# Patient Record
Sex: Male | Born: 1945 | Race: White | Hispanic: No | Smoking: Never smoker
Health system: Southern US, Community
[De-identification: ages and names within clinical notes are randomized; demographics above are authoritative.]

## PROBLEM LIST (undated history)

## (undated) DIAGNOSIS — K2100 Gastro-esophageal reflux disease with esophagitis, without bleeding: Secondary | ICD-10-CM

## (undated) HISTORY — PX: OTHER SURGICAL HISTORY: SHX169

---

## 2020-07-06 ENCOUNTER — Encounter (HOSPITAL_COMMUNITY): Payer: Self-pay

## 2020-07-06 ENCOUNTER — Other Ambulatory Visit: Payer: Self-pay

## 2020-07-06 ENCOUNTER — Emergency Department (HOSPITAL_COMMUNITY): Payer: Medicare HMO

## 2020-07-06 ENCOUNTER — Emergency Department (HOSPITAL_COMMUNITY)
Admission: EM | Admit: 2020-07-06 | Discharge: 2020-07-06 | Disposition: A | Discharge status: Home / Self Care | Payer: Medicare HMO | Attending: Emergency Medicine | Admitting: Emergency Medicine

## 2020-07-06 DIAGNOSIS — Y900 Blood alcohol level of less than 20 mg/100 ml: Secondary | ICD-10-CM | POA: Insufficient documentation

## 2020-07-06 DIAGNOSIS — W108XXA Fall (on) (from) other stairs and steps, initial encounter: Secondary | ICD-10-CM | POA: Insufficient documentation

## 2020-07-06 DIAGNOSIS — S0081XA Abrasion of other part of head, initial encounter: Secondary | ICD-10-CM | POA: Diagnosis not present

## 2020-07-06 DIAGNOSIS — W19XXXA Unspecified fall, initial encounter: Secondary | ICD-10-CM

## 2020-07-06 DIAGNOSIS — Y9301 Activity, walking, marching and hiking: Secondary | ICD-10-CM | POA: Diagnosis not present

## 2020-07-06 DIAGNOSIS — Z23 Encounter for immunization: Secondary | ICD-10-CM | POA: Insufficient documentation

## 2020-07-06 DIAGNOSIS — S0990XA Unspecified injury of head, initial encounter: Secondary | ICD-10-CM | POA: Diagnosis present

## 2020-07-06 HISTORY — DX: Gastro-esophageal reflux disease with esophagitis, without bleeding: K21.00

## 2020-07-06 LAB — CBC WITH DIFFERENTIAL/PLATELET
Abs Immature Granulocytes: 0.04 10*3/uL (ref 0.00–0.07)
Basophils Absolute: 0.1 10*3/uL (ref 0.0–0.1)
Basophils Relative: 0 %
Eosinophils Absolute: 0.8 10*3/uL — ABNORMAL HIGH (ref 0.0–0.5)
Eosinophils Relative: 7 %
HCT: 35.5 % — ABNORMAL LOW (ref 39.0–52.0)
Hemoglobin: 11.3 g/dL — ABNORMAL LOW (ref 13.0–17.0)
Immature Granulocytes: 0 %
Lymphocytes Relative: 14 %
Lymphs Abs: 1.6 10*3/uL (ref 0.7–4.0)
MCH: 29.7 pg (ref 26.0–34.0)
MCHC: 31.8 g/dL (ref 30.0–36.0)
MCV: 93.2 fL (ref 80.0–100.0)
Monocytes Absolute: 0.7 10*3/uL (ref 0.1–1.0)
Monocytes Relative: 6 %
Neutro Abs: 8.2 10*3/uL — ABNORMAL HIGH (ref 1.7–7.7)
Neutrophils Relative %: 73 %
Platelets: 227 10*3/uL (ref 150–400)
RBC: 3.81 MIL/uL — ABNORMAL LOW (ref 4.22–5.81)
RDW: 13.8 % (ref 11.5–15.5)
WBC: 11.4 10*3/uL — ABNORMAL HIGH (ref 4.0–10.5)
nRBC: 0 % (ref 0.0–0.2)

## 2020-07-06 LAB — COMPREHENSIVE METABOLIC PANEL
ALT: 18 U/L (ref 0–44)
AST: 17 U/L (ref 15–41)
Albumin: 2.9 g/dL — ABNORMAL LOW (ref 3.5–5.0)
Alkaline Phosphatase: 88 U/L (ref 38–126)
Anion gap: 9 (ref 5–15)
BUN: 21 mg/dL (ref 8–23)
CO2: 24 mmol/L (ref 22–32)
Calcium: 8.7 mg/dL — ABNORMAL LOW (ref 8.9–10.3)
Chloride: 105 mmol/L (ref 98–111)
Creatinine, Ser: 1 mg/dL (ref 0.61–1.24)
GFR, Estimated: >60 mL/min (ref >60)
Glucose, Bld: 84 mg/dL (ref 70–99)
Potassium: 4.2 mmol/L (ref 3.5–5.1)
Sodium: 138 mmol/L (ref 135–145)
Total Bilirubin: 0.7 mg/dL (ref 0.3–1.2)
Total Protein: 6.2 g/dL — ABNORMAL LOW (ref 6.5–8.1)

## 2020-07-06 LAB — ETHANOL: Alcohol, Ethyl (B): <10 mg/dL (ref <10)

## 2020-07-06 MED ORDER — TETANUS-DIPHTH-ACELL PERTUSSIS 5-2.5-18.5 LF-MCG/0.5 IM SUSY
0.5000 mL | PREFILLED_SYRINGE | Freq: Once | INTRAMUSCULAR | Status: AC
  Administered 2020-07-06: 0.5 mL via INTRAMUSCULAR
  Filled 2020-07-06: qty 0.5

## 2020-07-06 NOTE — ED Notes (Signed)
Pt ambulated to satisfaction.

## 2020-07-06 NOTE — ED Provider Notes (Signed)
[facility] EMERGENCY DEPARTMENT Provider Note   CSN: [id number] Arrival date & time: 07/06/20  1934     History Chief Complaint  Patient presents with  . Fall    Pt fell walking up stairs. Pt hit head on concrete. Pt denies any LOC. Pt denies any blood thinners. Pt has hematoma/abrasion to R side of forehead    Sanad Fearnow is a 75 y.o. male.  The history is provided by the patient and the EMS personnel.  Fall   Emil Weigold is a 75 y.o. male who presents to the Emergency Department complaining of fall. He presents the emergency department by EMS for evaluation of injuries following a fall. He was walking up the stairs and tripped and struck his head on concrete. He is unsure what made him trip. He denies any loss of consciousness. He was able to get himself back up. He denies any recent illnesses or preceding symptoms. He has no significant medical problems and only takes acid reflux medications. He lives at home alone.     Past Medical History:  Diagnosis Date  . Reflux esophagitis     There are no problems to display for this patient.  no significant past medical history.       No family history on file.  Social History   Tobacco Use  . Smoking status: Never Smoker  . Smokeless tobacco: Never Used  Substance Use Topics  . Alcohol use: Yes  . Drug use: Not Currently    Home Medications Prior to Admission medications   Not on File    Allergies    Penicillins  Review of Systems   Review of Systems  All other systems reviewed and are negative.   Physical Exam Updated Vital Signs BP (!) 132/94   Pulse 72   Resp (!) 26   Ht 5\' 9"  (1.753 m)   Wt 90 kg   SpO2 99%   BMI 29.30 kg/m   Physical Exam Vitals and nursing note reviewed.  Constitutional:      Appearance: He is well-developed.  HENT:     Head: Normocephalic.     Comments: Abrasion with swelling to the right temple. Cardiovascular:     Rate and Rhythm: Normal  rate and regular rhythm.     Heart sounds: No murmur heard.   Pulmonary:     Effort: Pulmonary effort is normal. No respiratory distress.     Breath sounds: Normal breath sounds.  Abdominal:     Palpations: Abdomen is soft.     Tenderness: There is no abdominal tenderness. There is no guarding or rebound.  Musculoskeletal:        General: No tenderness.  Skin:    General: Skin is warm and dry.  Neurological:     Mental Status: He is alert and oriented to person, place, and time.     Comments: Five out of five strength in all four extremities. Mildly dysarthria speech.  Psychiatric:        Behavior: Behavior normal.     ED Results / Procedures / Treatments   Labs (all labs ordered are listed, but only abnormal results are displayed) Labs Reviewed  COMPREHENSIVE METABOLIC PANEL - Abnormal; Notable for the following components:      Result Value   Calcium 8.7 (*)    Total Protein 6.2 (*)    Albumin 2.9 (*)    All other components within normal limits  CBC WITH DIFFERENTIAL/PLATELET - Abnormal; Notable for the following  components:   WBC 11.4 (*)    RBC 3.81 (*)    Hemoglobin 11.3 (*)    HCT 35.5 (*)    Neutro Abs 8.2 (*)    Eosinophils Absolute 0.8 (*)    All other components within normal limits  ETHANOL  URINALYSIS, ROUTINE W REFLEX MICROSCOPIC    EKG EKG Interpretation  Date/Time:  Sunday Jul 06 2020 19:33:00 EDT Ventricular Rate:  74 PR Interval:  169 QRS Duration: 93 QT Interval:  381 QTC Calculation: 423 R Axis:   57 Text Interpretation: Sinus rhythm Abnormal R-wave progression, early transition Borderline ST elevation, anterior leads Baseline wander in lead(s) V3 Confirmed by Tilden Fossa [phone]) on 07/06/2020 7:35:35 PM   Radiology DG Chest 2 View  Result Date: 07/06/2020 CLINICAL DATA:  Status post fall. EXAM: CHEST - 2 VIEW COMPARISON:  None. FINDINGS: Multiple overlying radiopaque cardiac lead wires are seen. There is no evidence of acute  infiltrate, pleural effusion or pneumothorax. The heart size and mediastinal contours are within normal limits. There is mild calcification of the thoracic aorta. Degenerative changes seen throughout the thoracic spine. IMPRESSION: No active cardiopulmonary disease. Electronically Signed   By: Aram Candela M.D.   On: 07/06/2020 20:28   CT Head Wo Contrast  Result Date: 07/06/2020 CLINICAL DATA:  Recent fall with headaches and neck pain, initial encounter EXAM: CT HEAD WITHOUT CONTRAST CT CERVICAL SPINE WITHOUT CONTRAST TECHNIQUE: Multidetector CT imaging of the head and cervical spine was performed following the standard protocol without intravenous contrast. Multiplanar CT image reconstructions of the cervical spine were also generated. COMPARISON:  None. FINDINGS: CT HEAD FINDINGS Brain: No evidence of acute infarction, hemorrhage, hydrocephalus, extra-axial collection or mass lesion/mass effect. Mild atrophic changes are noted. Vascular: No hyperdense vessel or unexpected calcification. Skull: Normal. Negative for fracture or focal lesion. Sinuses/Orbits: Mucosal thickening is noted within the right maxillary antrum although postsurgical changes are noted in the antrum bilaterally. Orbits and their contents appear within normal limits. Other: Soft tissue swelling is noted in the right frontal region consistent with the recent injury. CT CERVICAL SPINE FINDINGS Alignment: Within normal limits. Skull base and vertebrae: 7 cervical segments are well visualized. Vertebral body height is well maintained. Facet hypertrophic changes are seen. Osteophytic changes and disc space narrowing is noted throughout the cervical spine. No acute fracture or acute facet abnormality is noted. The odontoid is within normal limits. Soft tissues and spinal canal: Surrounding soft tissue structures are unremarkable Upper chest: Visualized lung apices are unremarkable. Other: None IMPRESSION: CT of the head: No acute intracranial  abnormality noted. Mild soft tissue swelling in the right frontal region consistent with the recent injury. Chronic appearing mucosal thickening in the right maxillary antrum. CT of the cervical spine: Multilevel degenerative change without acute abnormality. Electronically Signed   By: Alcide Clever M.D.   On: 07/06/2020 20:57   CT Cervical Spine Wo Contrast  Result Date: 07/06/2020 CLINICAL DATA:  Recent fall with headaches and neck pain, initial encounter EXAM: CT HEAD WITHOUT CONTRAST CT CERVICAL SPINE WITHOUT CONTRAST TECHNIQUE: Multidetector CT imaging of the head and cervical spine was performed following the standard protocol without intravenous contrast. Multiplanar CT image reconstructions of the cervical spine were also generated. COMPARISON:  None. FINDINGS: CT HEAD FINDINGS Brain: No evidence of acute infarction, hemorrhage, hydrocephalus, extra-axial collection or mass lesion/mass effect. Mild atrophic changes are noted. Vascular: No hyperdense vessel or unexpected calcification. Skull: Normal. Negative for fracture or focal lesion. Sinuses/Orbits: Mucosal thickening is  noted within the right maxillary antrum although postsurgical changes are noted in the antrum bilaterally. Orbits and their contents appear within normal limits. Other: Soft tissue swelling is noted in the right frontal region consistent with the recent injury. CT CERVICAL SPINE FINDINGS Alignment: Within normal limits. Skull base and vertebrae: 7 cervical segments are well visualized. Vertebral body height is well maintained. Facet hypertrophic changes are seen. Osteophytic changes and disc space narrowing is noted throughout the cervical spine. No acute fracture or acute facet abnormality is noted. The odontoid is within normal limits. Soft tissues and spinal canal: Surrounding soft tissue structures are unremarkable Upper chest: Visualized lung apices are unremarkable. Other: None IMPRESSION: CT of the head: No acute intracranial  abnormality noted. Mild soft tissue swelling in the right frontal region consistent with the recent injury. Chronic appearing mucosal thickening in the right maxillary antrum. CT of the cervical spine: Multilevel degenerative change without acute abnormality. Electronically Signed   By: Alcide Clever M.D.   On: 07/06/2020 20:57    Procedures Procedures   Medications Ordered in ED Medications  Tdap (BOOSTRIX) injection 0.5 mL (0.5 mLs Intramuscular Given 07/06/20 2311)    ED Course  I have reviewed the triage vital signs and the nursing notes.  Pertinent labs & imaging results that were available during my care of the patient were reviewed by me and considered in my medical decision making (see chart for details).    MDM Rules/Calculators/A&P                         patient here for evaluation of injuries following a trip and fall at home. He lives alone. He does have dysarthria speech but he is edentulous, no family is available for confirmation of his baseline speech. He does state that he feels that his baseline. He is able to ambulate with steady gait in the department. He declines offer for [place] to contact his family for an update of his status. Imaging is negative for acute intracranial abnormality. He does have a large abrasion to the temple, no area that is amenable to sutures or derma bond. Discussed with patient local wound care as well as outpatient follow-up and return precautions.  Final Clinical Impression(s) / ED Diagnoses Final diagnoses:  Fall, initial encounter  Abrasion of face, initial encounter    Rx / DC Orders ED Discharge Orders    None       Tilden Fossa, MD 07/07/20 0006

## 2022-01-14 IMAGING — CT CT CERVICAL SPINE W/O CM
2 of 3 series · 10 of 27 positions shown, 13 images · non-contrast
Comparison: None.

CLINICAL DATA: Recent fall with headaches and neck pain, initial
encounter

EXAM:
CT HEAD WITHOUT CONTRAST
CT CERVICAL SPINE WITHOUT CONTRAST
TECHNIQUE: Multidetector CT imaging of the head and cervical spine was
performed following the standard protocol without intravenous
contrast. Multiplanar CT image reconstructions of the cervical spine
were also generated.

[Series 12: sag bone · sagittal · 0.28mm/px · 5 of 68 slices shown, 6 images]
[im 23/68  bone]
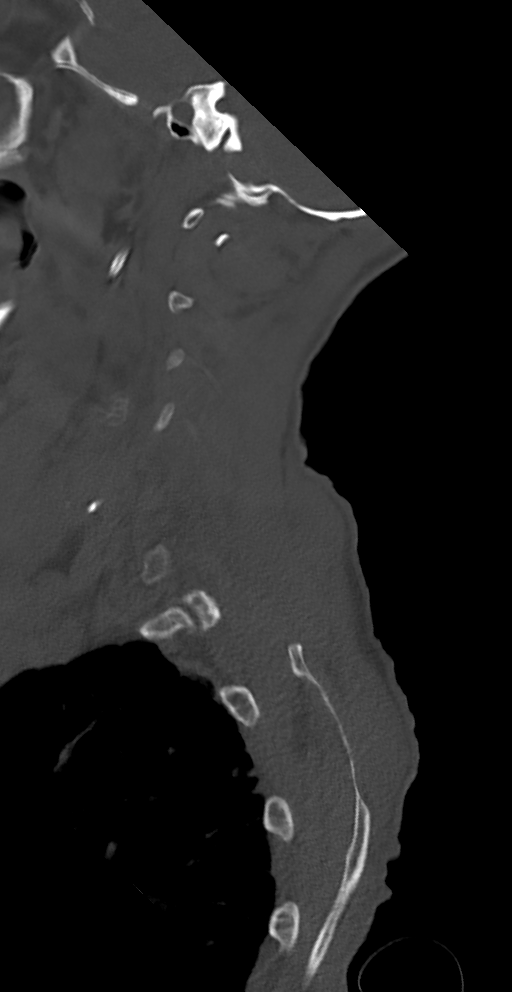
[im 28/68  bone]
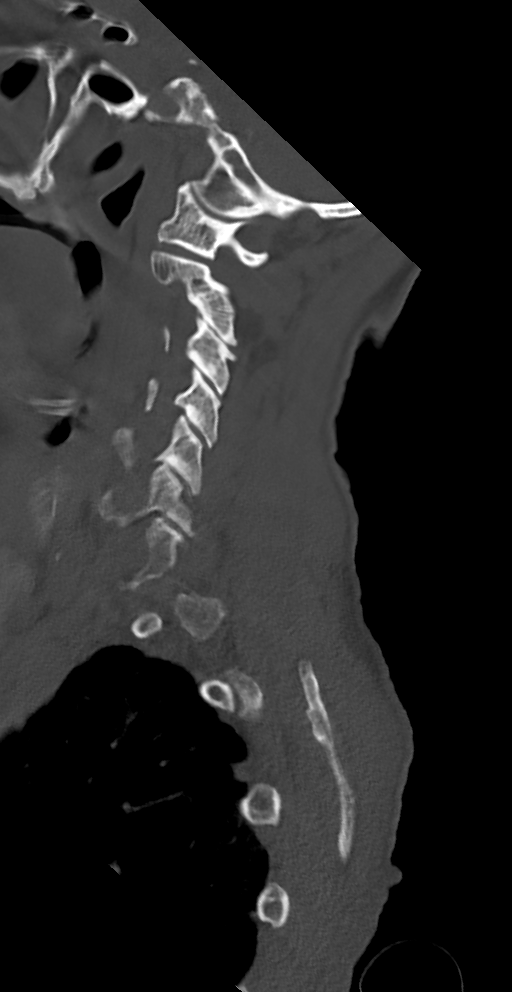
[im 34/68  soft-tissue]
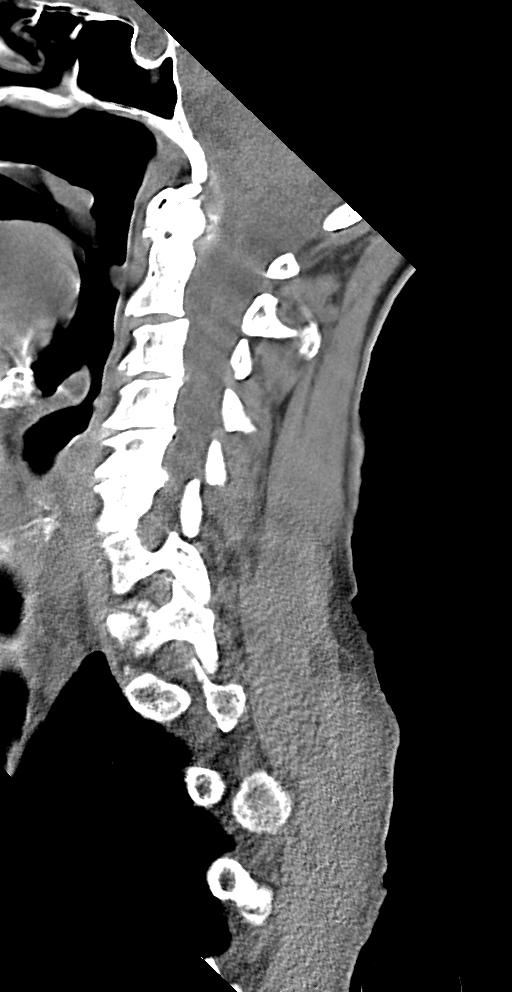
[im 34/68  bone]
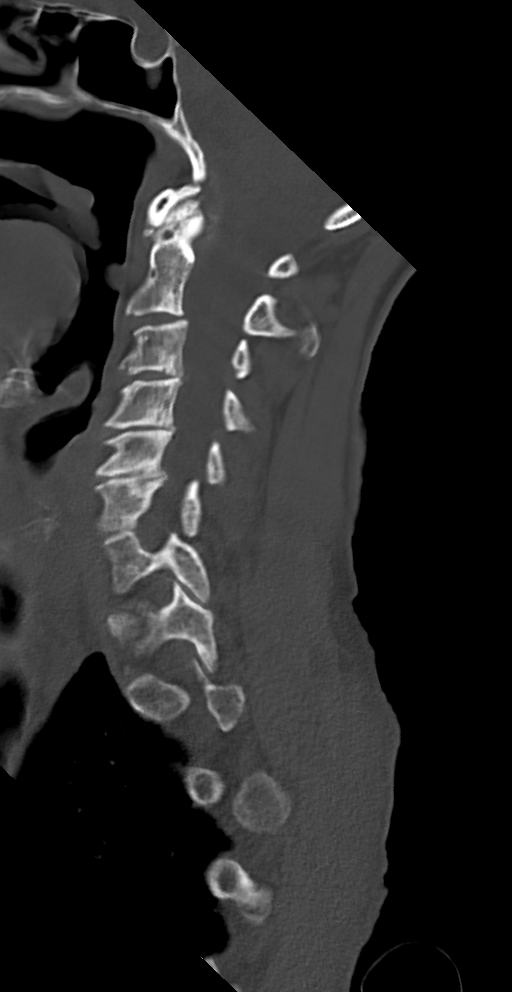
[im 40/68  bone]
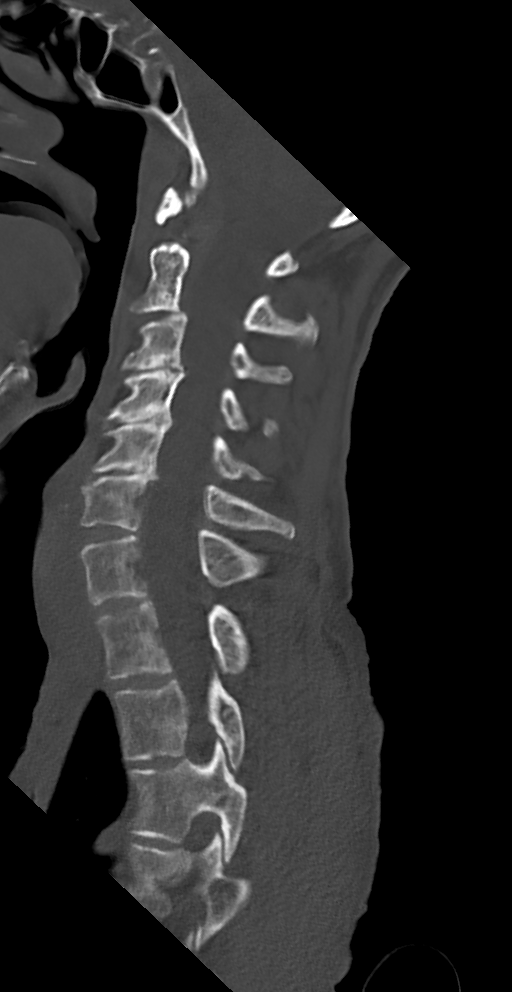
[im 45/68  bone]
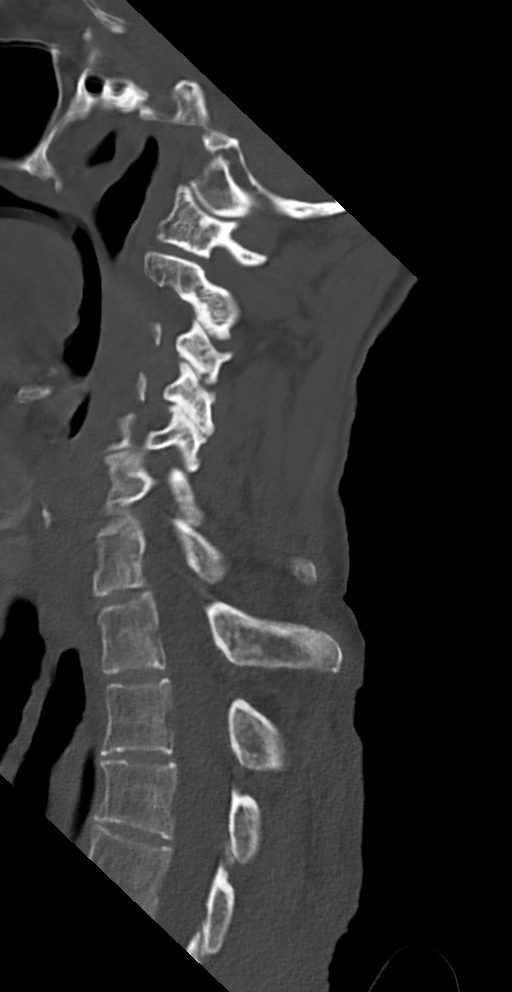

[Series 14: orthogonal axials · oblique · 0.21mm/px · 5 of 83 slices shown, 7 images]
[im 14/83  soft-tissue]
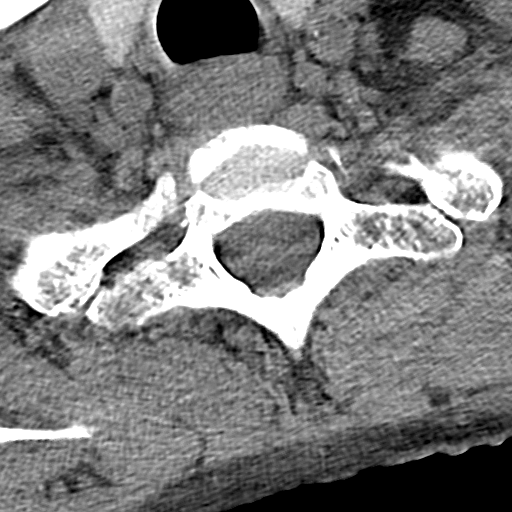
[im 14/83  bone]
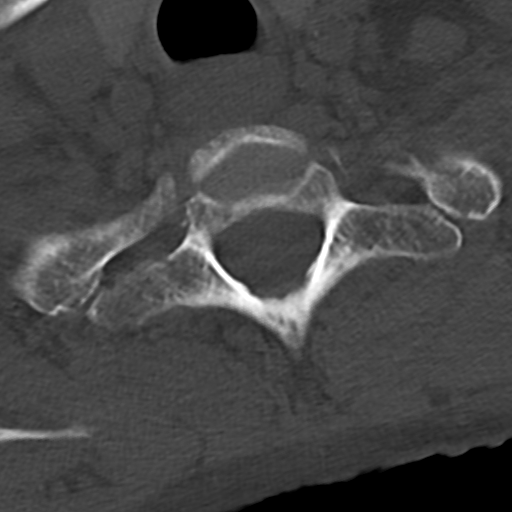
[im 28/83  bone]
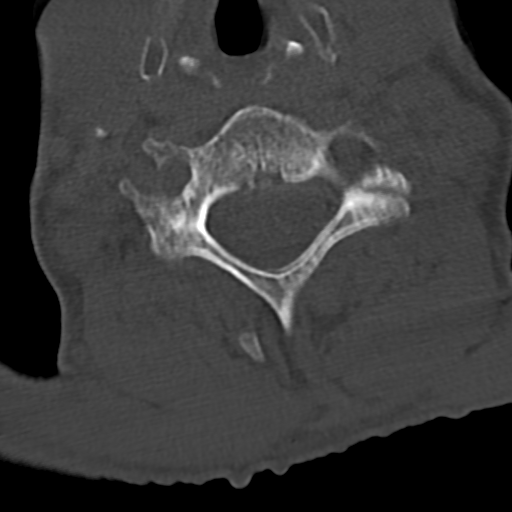
[im 42/83  bone]
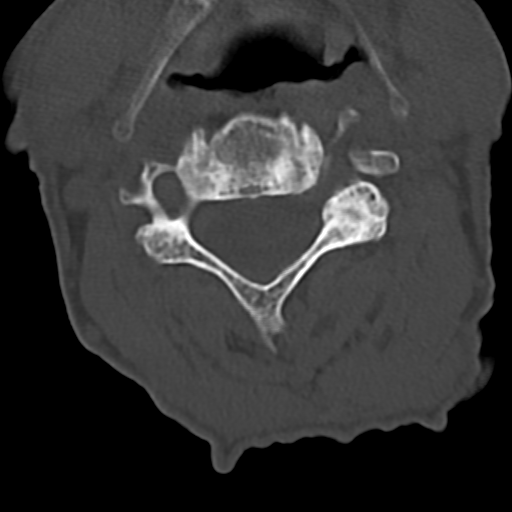
[im 55/83  bone]
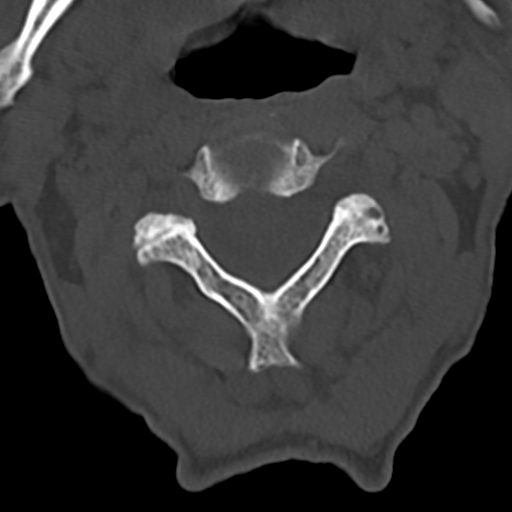
[im 69/83  soft-tissue]
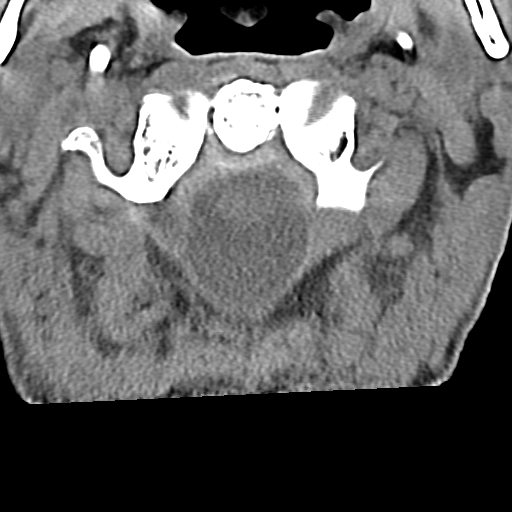
[im 69/83  bone]
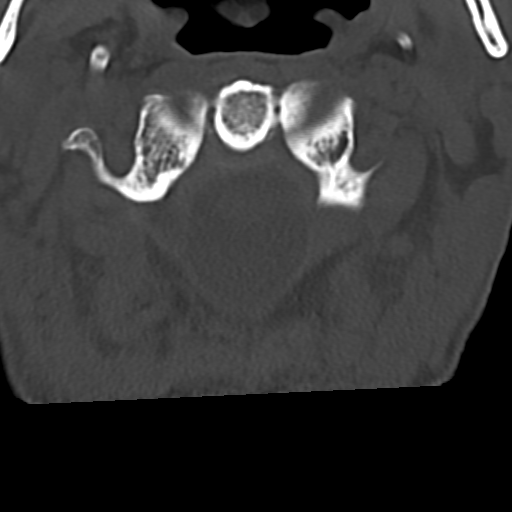

[10 of 27 positions shown; findings below may reference images not displayed]

FINDINGS: CT HEAD FINDINGS

Brain: No evidence of acute infarction, hemorrhage, hydrocephalus,
extra-axial collection or mass lesion/mass effect. Mild atrophic
changes are noted.

Vascular: No hyperdense vessel or unexpected calcification.

Skull: Normal. Negative for fracture or focal lesion.

Sinuses/Orbits: Mucosal thickening is noted within the right
maxillary antrum although postsurgical changes are noted in the
antrum bilaterally. Orbits and their contents appear within normal
limits.

Other: Soft tissue swelling is noted in the right frontal region
consistent with the recent injury.

CT CERVICAL SPINE FINDINGS

Alignment: Within normal limits.

Skull base and vertebrae: 7 cervical segments are well visualized.
Vertebral body height is well maintained. Facet hypertrophic changes
are seen. Osteophytic changes and disc space narrowing is noted
throughout the cervical spine. No acute fracture or acute facet
abnormality is noted. The odontoid is within normal limits.

Soft tissues and spinal canal: Surrounding soft tissue structures
are unremarkable

Upper chest: Visualized lung apices are unremarkable.

Other: None
IMPRESSION: CT of the head: No acute intracranial abnormality noted.

Mild soft tissue swelling in the right frontal region consistent
with the recent injury.

Chronic appearing mucosal thickening in the right maxillary antrum.

CT of the cervical spine: Multilevel degenerative change without
acute abnormality.
